# Patient Record
Sex: Male | Born: 1960 | Race: White | Hispanic: No | Marital: Single | Smoking: Never smoker
Health system: Southern US, Community
[De-identification: ages and names within clinical notes are randomized; demographics above are authoritative.]

## PROBLEM LIST (undated history)

## (undated) DIAGNOSIS — E871 Hypo-osmolality and hyponatremia: Secondary | ICD-10-CM

## (undated) DIAGNOSIS — G9340 Encephalopathy, unspecified: Secondary | ICD-10-CM

## (undated) DIAGNOSIS — H543 Unqualified visual loss, both eyes: Secondary | ICD-10-CM

## (undated) DIAGNOSIS — G40309 Generalized idiopathic epilepsy and epileptic syndromes, not intractable, without status epilepticus: Secondary | ICD-10-CM

## (undated) DIAGNOSIS — D696 Thrombocytopenia, unspecified: Secondary | ICD-10-CM

## (undated) DIAGNOSIS — G40209 Localization-related (focal) (partial) symptomatic epilepsy and epileptic syndromes with complex partial seizures, not intractable, without status epilepticus: Secondary | ICD-10-CM

## (undated) DIAGNOSIS — Z79899 Other long term (current) drug therapy: Secondary | ICD-10-CM

## (undated) HISTORY — DX: Unqualified visual loss, both eyes: H54.3

## (undated) HISTORY — DX: Hypo-osmolality and hyponatremia: E87.1

## (undated) HISTORY — PX: OTHER SURGICAL HISTORY: SHX169

## (undated) HISTORY — DX: Thrombocytopenia, unspecified: D69.6

## (undated) HISTORY — DX: Localization-related (focal) (partial) symptomatic epilepsy and epileptic syndromes with complex partial seizures, not intractable, without status epilepticus: G40.209

## (undated) HISTORY — DX: Encephalopathy, unspecified: G93.40

## (undated) HISTORY — DX: Generalized idiopathic epilepsy and epileptic syndromes, not intractable, without status epilepticus: G40.309

## (undated) HISTORY — DX: Other long term (current) drug therapy: Z79.899

---

## 2004-04-10 ENCOUNTER — Emergency Department (HOSPITAL_COMMUNITY): Admission: EM | Admit: 2004-04-10 | Discharge: 2004-04-11 | Payer: Self-pay | Admitting: Emergency Medicine

## 2008-03-20 ENCOUNTER — Emergency Department (HOSPITAL_COMMUNITY): Admission: EM | Admit: 2008-03-20 | Discharge: 2008-03-20 | Payer: Self-pay | Admitting: Emergency Medicine

## 2008-12-02 ENCOUNTER — Inpatient Hospital Stay (HOSPITAL_COMMUNITY): Admission: EM | Admit: 2008-12-02 | Discharge: 2008-12-20 | Payer: Self-pay | Admitting: Emergency Medicine

## 2008-12-25 ENCOUNTER — Inpatient Hospital Stay (HOSPITAL_COMMUNITY): Admission: EM | Admit: 2008-12-25 | Discharge: 2008-12-29 | Payer: Self-pay | Admitting: Emergency Medicine

## 2009-09-27 ENCOUNTER — Emergency Department (HOSPITAL_COMMUNITY): Admission: EM | Admit: 2009-09-27 | Discharge: 2009-09-27 | Payer: Self-pay | Admitting: Emergency Medicine

## 2009-12-12 ENCOUNTER — Encounter: Admission: RE | Admit: 2009-12-12 | Discharge: 2009-12-12 | Payer: Self-pay | Admitting: Nephrology

## 2009-12-26 ENCOUNTER — Emergency Department (HOSPITAL_COMMUNITY): Admission: EM | Admit: 2009-12-26 | Discharge: 2009-12-26 | Payer: Self-pay | Admitting: Emergency Medicine

## 2010-04-20 ENCOUNTER — Encounter: Payer: Self-pay | Admitting: Nephrology

## 2010-06-15 LAB — CBC
HCT: 34.5 % — ABNORMAL LOW (ref 39.0–52.0)
Hemoglobin: 12.1 g/dL — ABNORMAL LOW (ref 13.0–17.0)
MCHC: 35.2 g/dL (ref 30.0–36.0)
MCV: 92.9 fL (ref 78.0–100.0)
RBC: 3.72 MIL/uL — ABNORMAL LOW (ref 4.22–5.81)
WBC: 3.5 10*3/uL — ABNORMAL LOW (ref 4.0–10.5)

## 2010-06-15 LAB — DIFFERENTIAL
Basophils Absolute: 0 10*3/uL (ref 0.0–0.1)
Eosinophils Absolute: 0 10*3/uL (ref 0.0–0.7)
Lymphocytes Relative: 10 % — ABNORMAL LOW (ref 12–46)
Lymphs Abs: 0.4 10*3/uL — ABNORMAL LOW (ref 0.7–4.0)
Monocytes Absolute: 0.7 10*3/uL (ref 0.1–1.0)
Monocytes Relative: 20 % — ABNORMAL HIGH (ref 3–12)

## 2010-06-15 LAB — COMPREHENSIVE METABOLIC PANEL
Albumin: 3.4 g/dL — ABNORMAL LOW (ref 3.5–5.2)
BUN: 19 mg/dL (ref 6–23)
CO2: 21 mEq/L (ref 19–32)
Calcium: 10 mg/dL (ref 8.4–10.5)
Creatinine, Ser: 1.72 mg/dL — ABNORMAL HIGH (ref 0.4–1.5)
GFR calc Af Amer: 52 mL/min — ABNORMAL LOW (ref >60)
Sodium: 144 mEq/L (ref 135–145)

## 2010-06-15 LAB — URINALYSIS, ROUTINE W REFLEX MICROSCOPIC
Bilirubin Urine: NEGATIVE
Hgb urine dipstick: NEGATIVE
Ketones, ur: NEGATIVE mg/dL
Protein, ur: NEGATIVE mg/dL
Specific Gravity, Urine: 1.006 (ref 1.005–1.030)
Urobilinogen, UA: 1 mg/dL (ref 0.0–1.0)
pH: 6.5 (ref 5.0–8.0)

## 2010-07-03 LAB — BASIC METABOLIC PANEL
Calcium: 9.8 mg/dL (ref 8.4–10.5)
Creatinine, Ser: 1.18 mg/dL (ref 0.4–1.5)
GFR calc non Af Amer: >60 mL/min (ref >60)
Sodium: 145 mEq/L (ref 135–145)

## 2010-07-04 LAB — RENAL FUNCTION PANEL
Albumin: 4 g/dL (ref 3.5–5.2)
Albumin: 3.3 g/dL — ABNORMAL LOW (ref 3.5–5.2)
Albumin: 4.1 g/dL (ref 3.5–5.2)
Albumin: 4.4 g/dL (ref 3.5–5.2)
BUN: 14 mg/dL (ref 6–23)
BUN: 33 mg/dL — ABNORMAL HIGH (ref 6–23)
CO2: 22 meq/L (ref 19–32)
CO2: 25 mEq/L (ref 19–32)
Calcium: 10.6 mg/dL — ABNORMAL HIGH (ref 8.4–10.5)
Calcium: 10.9 mg/dL — ABNORMAL HIGH (ref 8.4–10.5)
Calcium: 9.9 mg/dL (ref 8.4–10.5)
Chloride: 101 mEq/L (ref 96–112)
Chloride: 101 mEq/L (ref 96–112)
Chloride: >130 meq/L — ABNORMAL HIGH (ref 96–112)
Creatinine, Ser: 1.45 mg/dL (ref 0.4–1.5)
Creatinine, Ser: 1.61 mg/dL — ABNORMAL HIGH (ref 0.4–1.5)
GFR calc Af Amer: 59 mL/min — ABNORMAL LOW (ref >60)
GFR calc Af Amer: >60 mL/min (ref >60)
GFR calc non Af Amer: 46 mL/min — ABNORMAL LOW
GFR calc non Af Amer: 49 mL/min — ABNORMAL LOW (ref >60)
GFR calc non Af Amer: 50 mL/min — ABNORMAL LOW (ref >60)
GFR calc non Af Amer: 52 mL/min — ABNORMAL LOW (ref >60)
Glucose, Bld: 147 mg/dL — ABNORMAL HIGH (ref 70–99)
Glucose, Bld: 96 mg/dL (ref 70–99)
Phosphorus: 4.2 mg/dL (ref 2.3–4.6)
Phosphorus: 2.6 mg/dL (ref 2.3–4.6)
Phosphorus: 4.3 mg/dL (ref 2.3–4.6)
Phosphorus: 4.4 mg/dL (ref 2.3–4.6)
Potassium: 4.1 mEq/L (ref 3.5–5.1)
Potassium: 3.3 meq/L — ABNORMAL LOW (ref 3.5–5.1)
Potassium: 3.8 mEq/L (ref 3.5–5.1)
Potassium: 4.1 mEq/L (ref 3.5–5.1)
Potassium: 4.1 mEq/L (ref 3.5–5.1)
Sodium: 131 mEq/L — ABNORMAL LOW (ref 135–145)
Sodium: 134 mEq/L — ABNORMAL LOW (ref 135–145)
Sodium: 135 mEq/L (ref 135–145)
Sodium: 163 meq/L (ref 135–145)

## 2010-07-04 LAB — COMPREHENSIVE METABOLIC PANEL
AST: 24 U/L (ref 0–37)
AST: 17 U/L (ref 0–37)
AST: 19 U/L (ref 0–37)
Albumin: 3.4 g/dL — ABNORMAL LOW (ref 3.5–5.2)
Albumin: 3.6 g/dL (ref 3.5–5.2)
Albumin: 4.3 g/dL (ref 3.5–5.2)
Alkaline Phosphatase: 61 U/L (ref 39–117)
BUN: 11 mg/dL (ref 6–23)
BUN: 12 mg/dL (ref 6–23)
BUN: 15 mg/dL (ref 6–23)
BUN: 12 mg/dL (ref 6–23)
BUN: 14 mg/dL (ref 6–23)
CO2: 21 mEq/L (ref 19–32)
Calcium: 9.2 mg/dL (ref 8.4–10.5)
Calcium: 9.7 mg/dL (ref 8.4–10.5)
Calcium: 10.6 mg/dL — ABNORMAL HIGH (ref 8.4–10.5)
Chloride: 108 mEq/L (ref 96–112)
Chloride: >130 mEq/L — ABNORMAL HIGH (ref 96–112)
Chloride: >130 mEq/L — ABNORMAL HIGH (ref 96–112)
Creatinine, Ser: 1 mg/dL (ref 0.4–1.5)
Creatinine, Ser: 1.05 mg/dL (ref 0.4–1.5)
Creatinine, Ser: 1.33 mg/dL (ref 0.4–1.5)
Creatinine, Ser: 1.49 mg/dL (ref 0.4–1.5)
Creatinine, Ser: 1.6 mg/dL — ABNORMAL HIGH (ref 0.4–1.5)
GFR calc Af Amer: >60 mL/min (ref >60)
GFR calc Af Amer: >60 mL/min (ref >60)
GFR calc non Af Amer: 47 mL/min — ABNORMAL LOW (ref >60)
Glucose, Bld: 161 mg/dL — ABNORMAL HIGH (ref 70–99)
Glucose, Bld: 92 mg/dL (ref 70–99)
Glucose, Bld: 181 mg/dL — ABNORMAL HIGH (ref 70–99)
Potassium: 3.9 mEq/L (ref 3.5–5.1)
Potassium: 3.9 mEq/L (ref 3.5–5.1)
Total Bilirubin: 0.4 mg/dL (ref 0.3–1.2)
Total Bilirubin: 0.5 mg/dL (ref 0.3–1.2)
Total Protein: 6 g/dL (ref 6.0–8.3)
Total Protein: 6.3 g/dL (ref 6.0–8.3)
Total Protein: 6.3 g/dL (ref 6.0–8.3)

## 2010-07-04 LAB — BASIC METABOLIC PANEL WITH GFR
BUN: 12 mg/dL (ref 6–23)
BUN: 12 mg/dL (ref 6–23)
BUN: 12 mg/dL (ref 6–23)
BUN: 12 mg/dL (ref 6–23)
BUN: 13 mg/dL (ref 6–23)
BUN: 13 mg/dL (ref 6–23)
BUN: 13 mg/dL (ref 6–23)
BUN: 13 mg/dL (ref 6–23)
BUN: 14 mg/dL (ref 6–23)
BUN: 14 mg/dL (ref 6–23)
BUN: 15 mg/dL (ref 6–23)
BUN: 15 mg/dL (ref 6–23)
BUN: 15 mg/dL (ref 6–23)
BUN: 16 mg/dL (ref 6–23)
BUN: 17 mg/dL (ref 6–23)
CO2: 18 meq/L — ABNORMAL LOW (ref 19–32)
CO2: 19 meq/L (ref 19–32)
CO2: 20 meq/L (ref 19–32)
CO2: 21 meq/L (ref 19–32)
CO2: 22 meq/L (ref 19–32)
CO2: 22 meq/L (ref 19–32)
CO2: 22 meq/L (ref 19–32)
CO2: 23 meq/L (ref 19–32)
CO2: 23 meq/L (ref 19–32)
CO2: 23 meq/L (ref 19–32)
CO2: 23 meq/L (ref 19–32)
CO2: 24 meq/L (ref 19–32)
CO2: 24 meq/L (ref 19–32)
CO2: 24 meq/L (ref 19–32)
CO2: 25 meq/L (ref 19–32)
Calcium: 10 mg/dL (ref 8.4–10.5)
Calcium: 10.2 mg/dL (ref 8.4–10.5)
Calcium: 10.2 mg/dL (ref 8.4–10.5)
Calcium: 10.3 mg/dL (ref 8.4–10.5)
Calcium: 10.3 mg/dL (ref 8.4–10.5)
Calcium: 10.7 mg/dL — ABNORMAL HIGH (ref 8.4–10.5)
Calcium: 9 mg/dL (ref 8.4–10.5)
Calcium: 9.3 mg/dL (ref 8.4–10.5)
Calcium: 9.3 mg/dL (ref 8.4–10.5)
Calcium: 9.4 mg/dL (ref 8.4–10.5)
Calcium: 9.4 mg/dL (ref 8.4–10.5)
Calcium: 9.4 mg/dL (ref 8.4–10.5)
Calcium: 9.5 mg/dL (ref 8.4–10.5)
Calcium: 9.5 mg/dL (ref 8.4–10.5)
Calcium: 9.8 mg/dL (ref 8.4–10.5)
Chloride: 125 meq/L — ABNORMAL HIGH (ref 96–112)
Chloride: 126 meq/L — ABNORMAL HIGH (ref 96–112)
Chloride: 127 meq/L — ABNORMAL HIGH (ref 96–112)
Chloride: 128 meq/L — ABNORMAL HIGH (ref 96–112)
Chloride: 130 meq/L — ABNORMAL HIGH (ref 96–112)
Chloride: >130 meq/L — ABNORMAL HIGH (ref 96–112)
Chloride: >130 meq/L — ABNORMAL HIGH (ref 96–112)
Chloride: >130 meq/L — ABNORMAL HIGH (ref 96–112)
Chloride: >130 meq/L — ABNORMAL HIGH (ref 96–112)
Chloride: >130 meq/L — ABNORMAL HIGH (ref 96–112)
Chloride: >130 meq/L — ABNORMAL HIGH (ref 96–112)
Chloride: >130 meq/L — ABNORMAL HIGH (ref 96–112)
Chloride: >130 meq/L — ABNORMAL HIGH (ref 96–112)
Chloride: >130 meq/L — ABNORMAL HIGH (ref 96–112)
Chloride: >130 meq/L — ABNORMAL HIGH (ref 96–112)
Creatinine, Ser: 1.14 mg/dL (ref 0.4–1.5)
Creatinine, Ser: 1.17 mg/dL (ref 0.4–1.5)
Creatinine, Ser: 1.2 mg/dL (ref 0.4–1.5)
Creatinine, Ser: 1.22 mg/dL (ref 0.4–1.5)
Creatinine, Ser: 1.24 mg/dL (ref 0.4–1.5)
Creatinine, Ser: 1.29 mg/dL (ref 0.4–1.5)
Creatinine, Ser: 1.41 mg/dL (ref 0.4–1.5)
Creatinine, Ser: 1.43 mg/dL (ref 0.4–1.5)
Creatinine, Ser: 1.54 mg/dL — ABNORMAL HIGH (ref 0.4–1.5)
Creatinine, Ser: 1.58 mg/dL — ABNORMAL HIGH (ref 0.4–1.5)
Creatinine, Ser: 1.6 mg/dL — ABNORMAL HIGH (ref 0.4–1.5)
Creatinine, Ser: 1.66 mg/dL — ABNORMAL HIGH (ref 0.4–1.5)
Creatinine, Ser: 1.71 mg/dL — ABNORMAL HIGH (ref 0.4–1.5)
Creatinine, Ser: 1.76 mg/dL — ABNORMAL HIGH (ref 0.4–1.5)
Creatinine, Ser: 1.77 mg/dL — ABNORMAL HIGH (ref 0.4–1.5)
GFR calc non Af Amer: 41 mL/min — ABNORMAL LOW
GFR calc non Af Amer: 42 mL/min — ABNORMAL LOW
GFR calc non Af Amer: 43 mL/min — ABNORMAL LOW
GFR calc non Af Amer: 45 mL/min — ABNORMAL LOW
GFR calc non Af Amer: 47 mL/min — ABNORMAL LOW
GFR calc non Af Amer: 47 mL/min — ABNORMAL LOW
GFR calc non Af Amer: 49 mL/min — ABNORMAL LOW
GFR calc non Af Amer: 53 mL/min — ABNORMAL LOW
GFR calc non Af Amer: 54 mL/min — ABNORMAL LOW
GFR calc non Af Amer: 60 mL/min — ABNORMAL LOW
GFR calc non Af Amer: >60 mL/min
GFR calc non Af Amer: >60 mL/min
GFR calc non Af Amer: >60 mL/min
GFR calc non Af Amer: >60 mL/min
GFR calc non Af Amer: >60 mL/min
Glucose, Bld: 114 mg/dL — ABNORMAL HIGH (ref 70–99)
Glucose, Bld: 117 mg/dL — ABNORMAL HIGH (ref 70–99)
Glucose, Bld: 118 mg/dL — ABNORMAL HIGH (ref 70–99)
Glucose, Bld: 121 mg/dL — ABNORMAL HIGH (ref 70–99)
Glucose, Bld: 122 mg/dL — ABNORMAL HIGH (ref 70–99)
Glucose, Bld: 125 mg/dL — ABNORMAL HIGH (ref 70–99)
Glucose, Bld: 127 mg/dL — ABNORMAL HIGH (ref 70–99)
Glucose, Bld: 130 mg/dL — ABNORMAL HIGH (ref 70–99)
Glucose, Bld: 134 mg/dL — ABNORMAL HIGH (ref 70–99)
Glucose, Bld: 134 mg/dL — ABNORMAL HIGH (ref 70–99)
Glucose, Bld: 136 mg/dL — ABNORMAL HIGH (ref 70–99)
Glucose, Bld: 141 mg/dL — ABNORMAL HIGH (ref 70–99)
Glucose, Bld: 153 mg/dL — ABNORMAL HIGH (ref 70–99)
Glucose, Bld: 160 mg/dL — ABNORMAL HIGH (ref 70–99)
Glucose, Bld: 166 mg/dL — ABNORMAL HIGH (ref 70–99)
Potassium: 3.3 meq/L — ABNORMAL LOW (ref 3.5–5.1)
Potassium: 3.4 meq/L — ABNORMAL LOW (ref 3.5–5.1)
Potassium: 3.4 meq/L — ABNORMAL LOW (ref 3.5–5.1)
Potassium: 3.4 meq/L — ABNORMAL LOW (ref 3.5–5.1)
Potassium: 3.5 meq/L (ref 3.5–5.1)
Potassium: 3.5 meq/L (ref 3.5–5.1)
Potassium: 3.5 meq/L (ref 3.5–5.1)
Potassium: 3.5 meq/L (ref 3.5–5.1)
Potassium: 3.6 meq/L (ref 3.5–5.1)
Potassium: 3.6 meq/L (ref 3.5–5.1)
Potassium: 3.6 meq/L (ref 3.5–5.1)
Potassium: 3.7 meq/L (ref 3.5–5.1)
Potassium: 3.8 meq/L (ref 3.5–5.1)
Potassium: 3.8 meq/L (ref 3.5–5.1)
Potassium: 3.8 meq/L (ref 3.5–5.1)
Sodium: 153 meq/L — ABNORMAL HIGH (ref 135–145)
Sodium: 153 meq/L — ABNORMAL HIGH (ref 135–145)
Sodium: 155 meq/L — ABNORMAL HIGH (ref 135–145)
Sodium: 156 meq/L — ABNORMAL HIGH (ref 135–145)
Sodium: 157 meq/L — ABNORMAL HIGH (ref 135–145)
Sodium: 157 meq/L — ABNORMAL HIGH (ref 135–145)
Sodium: 158 meq/L — ABNORMAL HIGH (ref 135–145)
Sodium: 163 meq/L (ref 135–145)
Sodium: 164 meq/L (ref 135–145)
Sodium: 173 meq/L (ref 135–145)
Sodium: 174 meq/L (ref 135–145)
Sodium: 177 meq/L (ref 135–145)
Sodium: 180 meq/L (ref 135–145)
Sodium: 183 meq/L (ref 135–145)
Sodium: 183 meq/L (ref 135–145)

## 2010-07-04 LAB — FERRITIN
Ferritin: 109 ng/mL (ref 22–322)
Ferritin: 186 ng/mL (ref 22–322)

## 2010-07-04 LAB — BASIC METABOLIC PANEL
BUN: 18 mg/dL (ref 6–23)
BUN: 28 mg/dL — ABNORMAL HIGH (ref 6–23)
BUN: 41 mg/dL — ABNORMAL HIGH (ref 6–23)
BUN: 11 mg/dL (ref 6–23)
BUN: 12 mg/dL (ref 6–23)
BUN: 14 mg/dL (ref 6–23)
BUN: 17 mg/dL (ref 6–23)
BUN: 17 mg/dL (ref 6–23)
CO2: 24 mEq/L (ref 19–32)
CO2: 30 mEq/L (ref 19–32)
CO2: 19 mEq/L (ref 19–32)
CO2: 21 mEq/L (ref 19–32)
CO2: 21 mEq/L (ref 19–32)
CO2: 21 mEq/L (ref 19–32)
CO2: 22 mEq/L (ref 19–32)
Calcium: 10 mg/dL (ref 8.4–10.5)
Calcium: 10.5 mg/dL (ref 8.4–10.5)
Calcium: 10.6 mg/dL — ABNORMAL HIGH (ref 8.4–10.5)
Calcium: 10.7 mg/dL — ABNORMAL HIGH (ref 8.4–10.5)
Calcium: 11 mg/dL — ABNORMAL HIGH (ref 8.4–10.5)
Calcium: 11.2 mg/dL — ABNORMAL HIGH (ref 8.4–10.5)
Calcium: 10 mg/dL (ref 8.4–10.5)
Calcium: 10.1 mg/dL (ref 8.4–10.5)
Calcium: 10.2 mg/dL (ref 8.4–10.5)
Calcium: 9.3 mg/dL (ref 8.4–10.5)
Calcium: 9.7 mg/dL (ref 8.4–10.5)
Chloride: 103 mEq/L (ref 96–112)
Chloride: 104 mEq/L (ref 96–112)
Chloride: 107 mEq/L (ref 96–112)
Chloride: 108 mEq/L (ref 96–112)
Chloride: 109 mEq/L (ref 96–112)
Chloride: 114 mEq/L — ABNORMAL HIGH (ref 96–112)
Chloride: 107 mEq/L (ref 96–112)
Chloride: 127 mEq/L — ABNORMAL HIGH (ref 96–112)
Chloride: >130 mEq/L — ABNORMAL HIGH (ref 96–112)
Chloride: >130 mEq/L — ABNORMAL HIGH (ref 96–112)
Chloride: >130 mEq/L — ABNORMAL HIGH (ref 96–112)
Chloride: >130 mEq/L — ABNORMAL HIGH (ref 96–112)
Chloride: >130 mEq/L — ABNORMAL HIGH (ref 96–112)
Chloride: >130 mEq/L — ABNORMAL HIGH (ref 96–112)
Creatinine, Ser: 1.03 mg/dL (ref 0.4–1.5)
Creatinine, Ser: 1.41 mg/dL (ref 0.4–1.5)
Creatinine, Ser: 1.59 mg/dL — ABNORMAL HIGH (ref 0.4–1.5)
Creatinine, Ser: 1.75 mg/dL — ABNORMAL HIGH (ref 0.4–1.5)
GFR calc Af Amer: >60 mL/min (ref >60)
GFR calc Af Amer: >60 mL/min (ref >60)
GFR calc Af Amer: >60 mL/min (ref >60)
GFR calc Af Amer: 56 mL/min — ABNORMAL LOW (ref >60)
GFR calc Af Amer: 58 mL/min — ABNORMAL LOW (ref >60)
GFR calc Af Amer: 58 mL/min — ABNORMAL LOW (ref >60)
GFR calc Af Amer: 59 mL/min — ABNORMAL LOW (ref >60)
GFR calc Af Amer: >60 mL/min (ref >60)
GFR calc Af Amer: >60 mL/min (ref >60)
GFR calc Af Amer: >60 mL/min (ref >60)
GFR calc Af Amer: >60 mL/min (ref >60)
GFR calc Af Amer: >60 mL/min (ref >60)
GFR calc non Af Amer: >60 mL/min (ref >60)
GFR calc non Af Amer: >60 mL/min (ref >60)
GFR calc non Af Amer: >60 mL/min (ref >60)
GFR calc non Af Amer: >60 mL/min (ref >60)
GFR calc non Af Amer: 46 mL/min — ABNORMAL LOW (ref >60)
GFR calc non Af Amer: 47 mL/min — ABNORMAL LOW (ref >60)
GFR calc non Af Amer: 48 mL/min — ABNORMAL LOW (ref >60)
GFR calc non Af Amer: 48 mL/min — ABNORMAL LOW (ref >60)
GFR calc non Af Amer: 49 mL/min — ABNORMAL LOW (ref >60)
GFR calc non Af Amer: 56 mL/min — ABNORMAL LOW (ref >60)
GFR calc non Af Amer: 57 mL/min — ABNORMAL LOW (ref >60)
GFR calc non Af Amer: >60 mL/min (ref >60)
GFR calc non Af Amer: >60 mL/min (ref >60)
Glucose, Bld: 119 mg/dL — ABNORMAL HIGH (ref 70–99)
Glucose, Bld: 81 mg/dL (ref 70–99)
Glucose, Bld: 91 mg/dL (ref 70–99)
Glucose, Bld: 118 mg/dL — ABNORMAL HIGH (ref 70–99)
Glucose, Bld: 121 mg/dL — ABNORMAL HIGH (ref 70–99)
Glucose, Bld: 123 mg/dL — ABNORMAL HIGH (ref 70–99)
Glucose, Bld: 133 mg/dL — ABNORMAL HIGH (ref 70–99)
Glucose, Bld: 150 mg/dL — ABNORMAL HIGH (ref 70–99)
Glucose, Bld: 163 mg/dL — ABNORMAL HIGH (ref 70–99)
Potassium: 3.2 mEq/L — ABNORMAL LOW (ref 3.5–5.1)
Potassium: 3.6 mEq/L (ref 3.5–5.1)
Potassium: 3.9 mEq/L (ref 3.5–5.1)
Potassium: 4.1 mEq/L (ref 3.5–5.1)
Potassium: 4.1 mEq/L (ref 3.5–5.1)
Potassium: 3.2 mEq/L — ABNORMAL LOW (ref 3.5–5.1)
Potassium: 3.2 mEq/L — ABNORMAL LOW (ref 3.5–5.1)
Potassium: 3.3 mEq/L — ABNORMAL LOW (ref 3.5–5.1)
Potassium: 3.4 mEq/L — ABNORMAL LOW (ref 3.5–5.1)
Potassium: 3.4 mEq/L — ABNORMAL LOW (ref 3.5–5.1)
Potassium: 3.5 mEq/L (ref 3.5–5.1)
Potassium: 3.5 mEq/L (ref 3.5–5.1)
Potassium: 3.5 mEq/L (ref 3.5–5.1)
Potassium: 3.6 mEq/L (ref 3.5–5.1)
Potassium: 3.6 mEq/L (ref 3.5–5.1)
Potassium: 3.7 mEq/L (ref 3.5–5.1)
Potassium: 3.9 mEq/L (ref 3.5–5.1)
Sodium: 137 mEq/L (ref 135–145)
Sodium: 138 mEq/L (ref 135–145)
Sodium: 143 mEq/L (ref 135–145)
Sodium: 144 mEq/L (ref 135–145)
Sodium: 146 mEq/L — ABNORMAL HIGH (ref 135–145)
Sodium: 145 mEq/L (ref 135–145)
Sodium: 148 mEq/L — ABNORMAL HIGH (ref 135–145)
Sodium: 150 mEq/L — ABNORMAL HIGH (ref 135–145)
Sodium: 158 mEq/L — ABNORMAL HIGH (ref 135–145)
Sodium: 158 mEq/L — ABNORMAL HIGH (ref 135–145)
Sodium: 159 mEq/L — ABNORMAL HIGH (ref 135–145)
Sodium: 161 mEq/L (ref 135–145)
Sodium: 161 mEq/L (ref 135–145)
Sodium: 164 mEq/L (ref 135–145)
Sodium: 174 mEq/L (ref 135–145)
Sodium: 175 mEq/L (ref 135–145)

## 2010-07-04 LAB — CBC
HCT: 29.9 % — ABNORMAL LOW (ref 39.0–52.0)
HCT: 31.3 % — ABNORMAL LOW (ref 39.0–52.0)
HCT: 32 % — ABNORMAL LOW (ref 39.0–52.0)
HCT: 32.8 % — ABNORMAL LOW (ref 39.0–52.0)
HCT: 35.9 % — ABNORMAL LOW (ref 39.0–52.0)
HCT: 40.7 % (ref 39.0–52.0)
Hemoglobin: 10.5 g/dL — ABNORMAL LOW (ref 13.0–17.0)
Hemoglobin: 10.8 g/dL — ABNORMAL LOW (ref 13.0–17.0)
Hemoglobin: 11 g/dL — ABNORMAL LOW (ref 13.0–17.0)
Hemoglobin: 12.1 g/dL — ABNORMAL LOW (ref 13.0–17.0)
MCHC: 34.6 g/dL (ref 30.0–36.0)
MCHC: 35 g/dL (ref 30.0–36.0)
MCHC: 33.4 g/dL (ref 30.0–36.0)
MCHC: 33.6 g/dL (ref 30.0–36.0)
MCV: 85.3 fL (ref 78.0–100.0)
MCV: 87.3 fL (ref 78.0–100.0)
MCV: 87.4 fL (ref 78.0–100.0)
MCV: 90 fL (ref 78.0–100.0)
MCV: 90.6 fL (ref 78.0–100.0)
Platelets: 122 10*3/uL — ABNORMAL LOW (ref 150–400)
Platelets: 134 10*3/uL — ABNORMAL LOW (ref 150–400)
Platelets: 123 K/uL — ABNORMAL LOW (ref 150–400)
Platelets: 133 10*3/uL — ABNORMAL LOW (ref 150–400)
Platelets: 93 K/uL — ABNORMAL LOW (ref 150–400)
RBC: 3.62 MIL/uL — ABNORMAL LOW (ref 4.22–5.81)
RBC: 3.66 MIL/uL — ABNORMAL LOW (ref 4.22–5.81)
RBC: 3.99 MIL/uL — ABNORMAL LOW (ref 4.22–5.81)
RDW: 13.9 % (ref 11.5–15.5)
RDW: 14 % (ref 11.5–15.5)
RDW: 14.9 % (ref 11.5–15.5)
RDW: 15.9 % — ABNORMAL HIGH (ref 11.5–15.5)
WBC: 4.1 10*3/uL (ref 4.0–10.5)
WBC: 5.9 K/uL (ref 4.0–10.5)
WBC: 7.9 K/uL (ref 4.0–10.5)
WBC: 8.6 10*3/uL (ref 4.0–10.5)

## 2010-07-04 LAB — MAGNESIUM
Magnesium: 1.8 mg/dL (ref 1.5–2.5)
Magnesium: 2.2 mg/dL (ref 1.5–2.5)
Magnesium: 2.7 mg/dL — ABNORMAL HIGH (ref 1.5–2.5)

## 2010-07-04 LAB — URINALYSIS, ROUTINE W REFLEX MICROSCOPIC
Bilirubin Urine: NEGATIVE
Glucose, UA: NEGATIVE mg/dL
Glucose, UA: NEGATIVE mg/dL
Hgb urine dipstick: NEGATIVE
Ketones, ur: NEGATIVE mg/dL
Ketones, ur: NEGATIVE mg/dL
Leukocytes, UA: NEGATIVE
Nitrite: NEGATIVE
Nitrite: NEGATIVE
Protein, ur: NEGATIVE mg/dL
Protein, ur: NEGATIVE mg/dL
Specific Gravity, Urine: 1.008 (ref 1.005–1.030)
Specific Gravity, Urine: 1.009 (ref 1.005–1.030)
Urobilinogen, UA: 0.2 mg/dL (ref 0.0–1.0)
Urobilinogen, UA: 0.2 mg/dL (ref 0.0–1.0)
Urobilinogen, UA: 0.2 mg/dL (ref 0.0–1.0)
pH: 6 (ref 5.0–8.0)
pH: 6.5 (ref 5.0–8.0)

## 2010-07-04 LAB — POCT I-STAT, CHEM 8
BUN: 16 mg/dL (ref 6–23)
Creatinine, Ser: 1 mg/dL (ref 0.4–1.5)
Hemoglobin: 10.2 g/dL — ABNORMAL LOW (ref 13.0–17.0)
Potassium: 3.8 mEq/L (ref 3.5–5.1)
Sodium: 124 mEq/L — ABNORMAL LOW (ref 135–145)
TCO2: 17 mmol/L (ref 0–100)

## 2010-07-04 LAB — POCT I-STAT 4, (NA,K, GLUC, HGB,HCT)
Glucose, Bld: 120 mg/dL — ABNORMAL HIGH (ref 70–99)
HCT: 31 % — ABNORMAL LOW (ref 39.0–52.0)
Hemoglobin: 10.5 g/dL — ABNORMAL LOW (ref 13.0–17.0)
Potassium: 3.7 meq/L (ref 3.5–5.1)
Sodium: 172 meq/L (ref 135–145)

## 2010-07-04 LAB — GLUCOSE, CAPILLARY
Glucose-Capillary: 153 mg/dL — ABNORMAL HIGH (ref 70–99)
Glucose-Capillary: 108 mg/dL — ABNORMAL HIGH (ref 70–99)

## 2010-07-04 LAB — RETICULOCYTES
RBC.: 3.63 MIL/uL — ABNORMAL LOW (ref 4.22–5.81)
Retic Ct Pct: 1.4 % (ref 0.4–3.1)

## 2010-07-04 LAB — LITHIUM LEVEL
Lithium Lvl: <0.25 mEq/L — ABNORMAL LOW (ref 0.80–1.40)
Lithium Lvl: 0.58 meq/L — ABNORMAL LOW (ref 0.80–1.40)

## 2010-07-04 LAB — MRSA PCR SCREENING: MRSA by PCR: NEGATIVE

## 2010-07-04 LAB — DIFFERENTIAL
Basophils Absolute: 0 10*3/uL (ref 0.0–0.1)
Basophils Relative: 0 % (ref 0–1)
Basophils Relative: 0 % (ref 0–1)
Eosinophils Absolute: 0 10*3/uL (ref 0.0–0.7)
Eosinophils Relative: 0 % (ref 0–5)
Lymphocytes Relative: 3 % — ABNORMAL LOW (ref 12–46)
Lymphs Abs: 0.5 10*3/uL — ABNORMAL LOW (ref 0.7–4.0)
Monocytes Absolute: 0.3 10*3/uL (ref 0.1–1.0)
Monocytes Relative: 3 % (ref 3–12)
Neutro Abs: 9.1 10*3/uL — ABNORMAL HIGH (ref 1.7–7.7)
Neutrophils Relative %: 94 % — ABNORMAL HIGH (ref 43–77)
Neutrophils Relative %: 91 % — ABNORMAL HIGH (ref 43–77)

## 2010-07-04 LAB — OSMOLALITY, URINE
Osmolality, Ur: 184 mOsm/kg — ABNORMAL LOW (ref 390–1090)
Osmolality, Ur: 232 mosm/kg — ABNORMAL LOW (ref 390–1090)
Osmolality, Ur: 232 mosm/kg — ABNORMAL LOW (ref 390–1090)
Osmolality, Ur: 359 mosm/kg — ABNORMAL LOW (ref 390–1090)
Osmolality, Ur: 437 mosm/kg (ref 390–1090)
Osmolality, Ur: 457 mosm/kg (ref 390–1090)

## 2010-07-04 LAB — T4, FREE: Free T4: 1.49 ng/dL (ref 0.80–1.80)

## 2010-07-04 LAB — URINE MICROSCOPIC-ADD ON

## 2010-07-04 LAB — URINE CULTURE
Colony Count: NO GROWTH
Culture: NO GROWTH
Culture: NO GROWTH

## 2010-07-04 LAB — HEMOGLOBIN A1C
Hgb A1c MFr Bld: 4.8 % (ref 4.6–6.1)
Mean Plasma Glucose: 88 mg/dL
Mean Plasma Glucose: 91 mg/dL

## 2010-07-04 LAB — PROTIME-INR
INR: 1.2 (ref 0.00–1.49)
Prothrombin Time: 14.7 s (ref 11.6–15.2)

## 2010-07-04 LAB — IRON AND TIBC
Saturation Ratios: 52 % (ref 20–55)
TIBC: 229 ug/dL (ref 215–435)
UIBC: 111 ug/dL

## 2010-07-04 LAB — CARBAMAZEPINE LEVEL, TOTAL
Carbamazepine Lvl: 3.3 ug/mL — ABNORMAL LOW (ref 4.0–12.0)
Carbamazepine Lvl: 8.4 ug/mL (ref 4.0–12.0)

## 2010-07-04 LAB — APTT: aPTT: 25 seconds (ref 24–37)

## 2010-07-04 LAB — TSH: TSH: 0.37 u[IU]/mL (ref 0.350–4.500)

## 2010-07-04 LAB — PHOSPHORUS: Phosphorus: 3 mg/dL (ref 2.3–4.6)

## 2010-10-28 ENCOUNTER — Ambulatory Visit: Payer: Medicare Other | Attending: Neurology | Admitting: Physical Therapy

## 2010-10-28 DIAGNOSIS — IMO0001 Reserved for inherently not codable concepts without codable children: Secondary | ICD-10-CM | POA: Insufficient documentation

## 2010-10-28 DIAGNOSIS — R269 Unspecified abnormalities of gait and mobility: Secondary | ICD-10-CM | POA: Insufficient documentation

## 2010-10-28 DIAGNOSIS — M6281 Muscle weakness (generalized): Secondary | ICD-10-CM | POA: Insufficient documentation

## 2010-11-05 ENCOUNTER — Ambulatory Visit: Payer: Medicare Other | Attending: Neurology | Admitting: Physical Therapy

## 2010-11-05 DIAGNOSIS — R269 Unspecified abnormalities of gait and mobility: Secondary | ICD-10-CM | POA: Insufficient documentation

## 2010-11-05 DIAGNOSIS — IMO0001 Reserved for inherently not codable concepts without codable children: Secondary | ICD-10-CM | POA: Insufficient documentation

## 2010-11-05 DIAGNOSIS — M6281 Muscle weakness (generalized): Secondary | ICD-10-CM | POA: Insufficient documentation

## 2010-11-10 ENCOUNTER — Ambulatory Visit: Payer: Medicare Other | Admitting: Physical Therapy

## 2010-11-12 ENCOUNTER — Ambulatory Visit: Payer: Medicare Other | Admitting: Physical Therapy

## 2010-11-17 ENCOUNTER — Ambulatory Visit: Payer: Medicare Other | Admitting: Physical Therapy

## 2010-11-19 ENCOUNTER — Ambulatory Visit: Payer: Medicare Other | Admitting: Occupational Therapy

## 2010-11-24 ENCOUNTER — Ambulatory Visit: Payer: Medicare Other | Admitting: Physical Therapy

## 2010-11-24 ENCOUNTER — Ambulatory Visit: Payer: Medicare Other | Admitting: Occupational Therapy

## 2010-12-02 ENCOUNTER — Ambulatory Visit: Payer: Medicare Other | Attending: Neurology | Admitting: Occupational Therapy

## 2010-12-02 DIAGNOSIS — R269 Unspecified abnormalities of gait and mobility: Secondary | ICD-10-CM | POA: Insufficient documentation

## 2010-12-02 DIAGNOSIS — M6281 Muscle weakness (generalized): Secondary | ICD-10-CM | POA: Insufficient documentation

## 2010-12-02 DIAGNOSIS — IMO0001 Reserved for inherently not codable concepts without codable children: Secondary | ICD-10-CM | POA: Insufficient documentation

## 2010-12-04 ENCOUNTER — Ambulatory Visit: Payer: Medicare Other | Admitting: Occupational Therapy

## 2010-12-09 ENCOUNTER — Ambulatory Visit: Payer: Medicare Other | Admitting: Occupational Therapy

## 2010-12-11 ENCOUNTER — Ambulatory Visit: Payer: Medicare Other | Admitting: Occupational Therapy

## 2010-12-16 ENCOUNTER — Ambulatory Visit: Payer: Medicare Other | Admitting: Occupational Therapy

## 2010-12-18 ENCOUNTER — Ambulatory Visit: Payer: Medicare Other | Admitting: Occupational Therapy

## 2010-12-23 ENCOUNTER — Encounter: Payer: Medicare Other | Admitting: Occupational Therapy

## 2010-12-25 ENCOUNTER — Encounter: Payer: Medicare Other | Admitting: Occupational Therapy

## 2010-12-31 ENCOUNTER — Encounter: Payer: Medicare Other | Admitting: Occupational Therapy

## 2011-01-02 LAB — CBC
Platelets: 92 10*3/uL — ABNORMAL LOW (ref 150–400)
RBC: 4.64 MIL/uL (ref 4.22–5.81)
WBC: 5.5 10*3/uL (ref 4.0–10.5)

## 2011-01-02 LAB — COMPREHENSIVE METABOLIC PANEL
ALT: 32 U/L (ref 0–53)
AST: 19 U/L (ref 0–37)
Albumin: 3.8 g/dL (ref 3.5–5.2)
CO2: 30 mEq/L (ref 19–32)
Chloride: 119 mEq/L — ABNORMAL HIGH (ref 96–112)
Creatinine, Ser: 1.62 mg/dL — ABNORMAL HIGH (ref 0.4–1.5)
GFR calc Af Amer: 56 mL/min — ABNORMAL LOW (ref >60)
GFR calc non Af Amer: 46 mL/min — ABNORMAL LOW (ref >60)
Potassium: 4.3 mEq/L (ref 3.5–5.1)
Sodium: 155 mEq/L — ABNORMAL HIGH (ref 135–145)
Total Bilirubin: 0.6 mg/dL (ref 0.3–1.2)

## 2011-01-02 LAB — URINALYSIS, ROUTINE W REFLEX MICROSCOPIC
Bilirubin Urine: NEGATIVE
Glucose, UA: NEGATIVE mg/dL
Ketones, ur: NEGATIVE mg/dL
Protein, ur: NEGATIVE mg/dL
pH: 6.5 (ref 5.0–8.0)

## 2011-01-02 LAB — DIFFERENTIAL
Basophils Absolute: 0 10*3/uL (ref 0.0–0.1)
Eosinophils Absolute: 0.1 10*3/uL (ref 0.0–0.7)
Eosinophils Relative: 1 % (ref 0–5)
Lymphocytes Relative: 18 % (ref 12–46)
Monocytes Absolute: 0.3 10*3/uL (ref 0.1–1.0)

## 2011-01-13 ENCOUNTER — Encounter: Payer: Medicare Other | Admitting: Occupational Therapy
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
## 2011-03-16 IMAGING — CR DG CHEST 1V PORT
1 series · 1 of 1 positions shown · non-contrast
Comparison: 12/03/2008

CLINICAL DATA: PICC line placement.

PORTABLE CHEST - 1 VIEW

[AP]
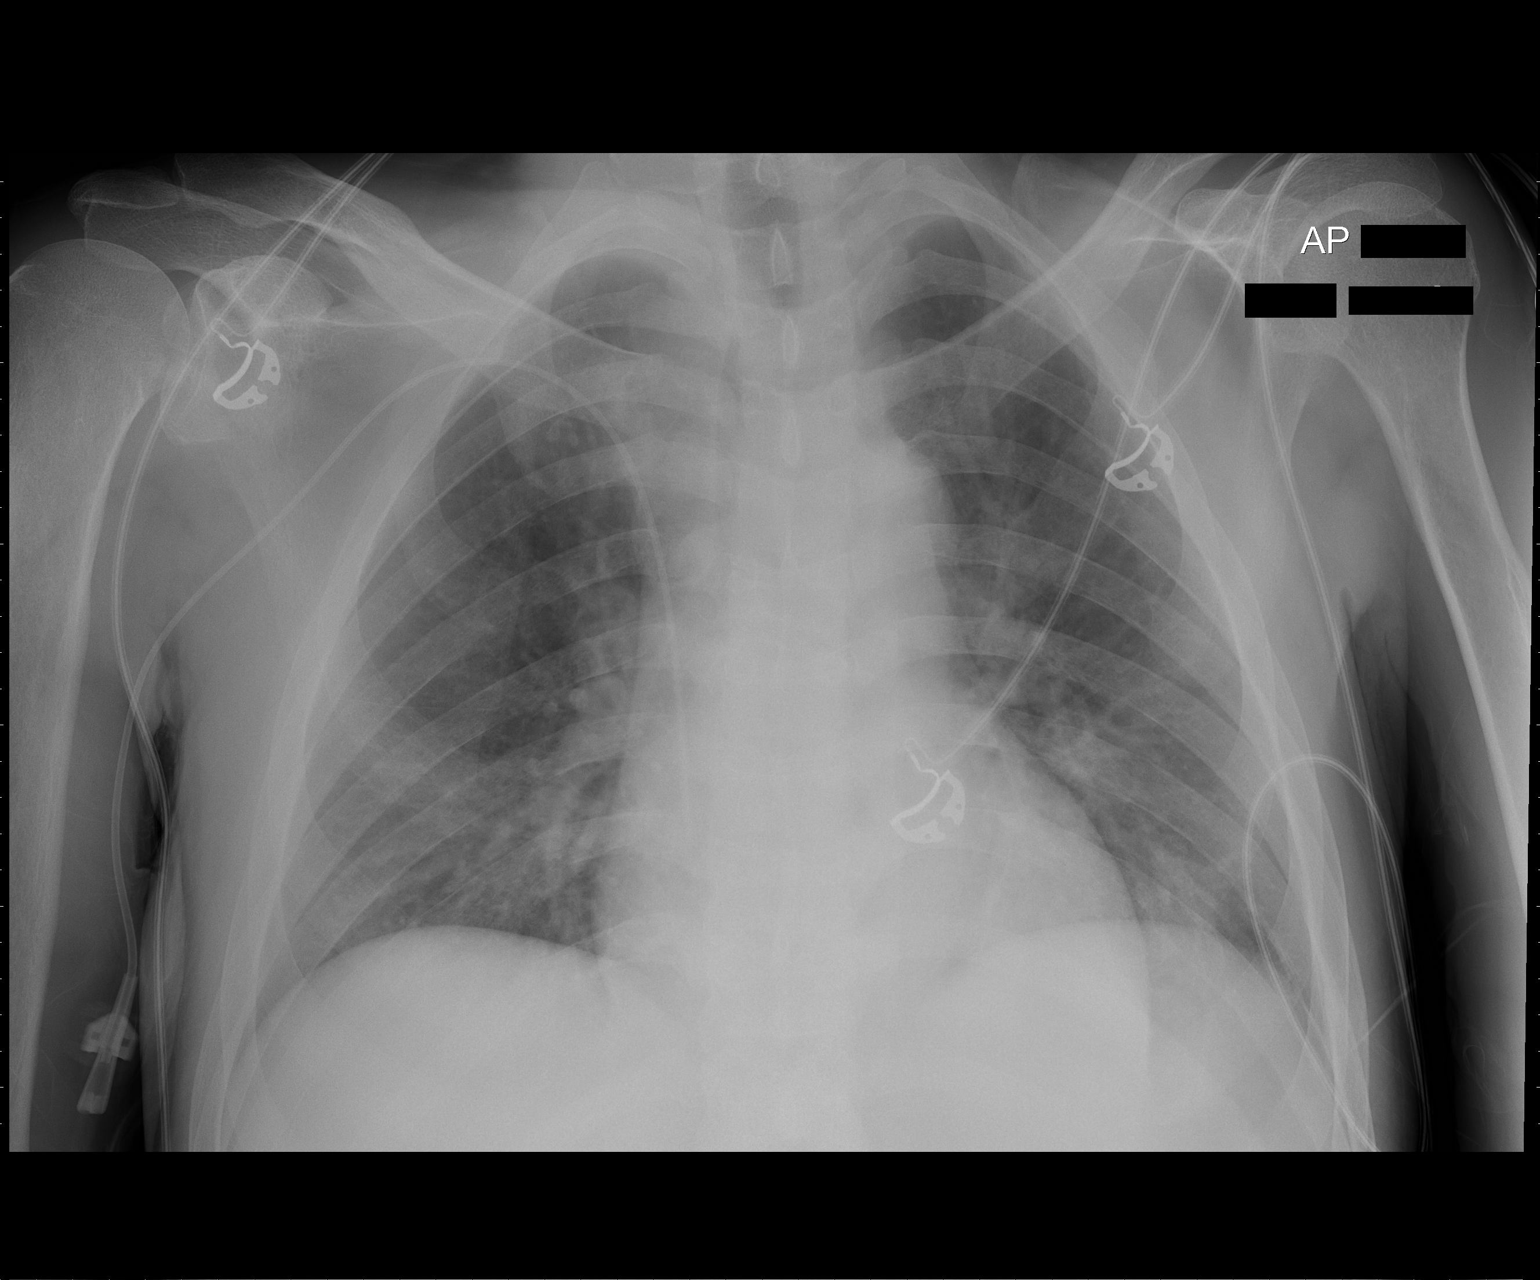

[1 of 1 positions shown; findings below may reference images not displayed]

FINDINGS: The cardiac silhouette, mediastinal and hilar contours
are stable.  The right PICC line tip is in the right atrium.  This
should be pulled back approximately 15 to 20mm.
IMPRESSION: Right PICC line tip is just into the right atrium.  This should be
pulled back approximately 15-20 mm.

## 2012-08-24 ENCOUNTER — Other Ambulatory Visit: Payer: Self-pay | Admitting: Neurology

## 2012-09-22 ENCOUNTER — Other Ambulatory Visit: Payer: Self-pay | Admitting: Neurology

## 2012-10-13 ENCOUNTER — Other Ambulatory Visit: Payer: Self-pay | Admitting: Neurology

## 2012-10-17 ENCOUNTER — Encounter: Payer: Self-pay | Admitting: Neurology

## 2012-10-17 ENCOUNTER — Ambulatory Visit (INDEPENDENT_AMBULATORY_CARE_PROVIDER_SITE_OTHER): Payer: Medicare Other | Admitting: Neurology

## 2012-10-17 ENCOUNTER — Other Ambulatory Visit: Payer: Self-pay | Admitting: Neurology

## 2012-10-17 ENCOUNTER — Telehealth: Payer: Self-pay | Admitting: Neurology

## 2012-10-17 VITALS — BP 139/84 | HR 114 | Ht 65.0 in | Wt 205.0 lb

## 2012-10-17 DIAGNOSIS — G40309 Generalized idiopathic epilepsy and epileptic syndromes, not intractable, without status epilepticus: Secondary | ICD-10-CM | POA: Insufficient documentation

## 2012-10-17 DIAGNOSIS — E871 Hypo-osmolality and hyponatremia: Secondary | ICD-10-CM

## 2012-10-17 DIAGNOSIS — G9340 Encephalopathy, unspecified: Secondary | ICD-10-CM

## 2012-10-17 DIAGNOSIS — D696 Thrombocytopenia, unspecified: Secondary | ICD-10-CM

## 2012-10-17 DIAGNOSIS — Z79899 Other long term (current) drug therapy: Secondary | ICD-10-CM | POA: Insufficient documentation

## 2012-10-17 MED ORDER — LEVETIRACETAM 500 MG PO TABS: 1000.0000 mg | ORAL_TABLET | Freq: Two times a day (BID) | ORAL | Status: DC
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
## 2012-10-17 NOTE — Telephone Encounter (Signed)
I called and spoke with Ms. Lance Campbell and patient will be seen today by Dr. Terrace Arabia

## 2013-05-19 ENCOUNTER — Ambulatory Visit: Payer: Medicaid Other | Admitting: Neurology

## 2013-05-26 ENCOUNTER — Ambulatory Visit (INDEPENDENT_AMBULATORY_CARE_PROVIDER_SITE_OTHER): Payer: Medicare Other | Admitting: Neurology

## 2013-05-26 ENCOUNTER — Encounter (INDEPENDENT_AMBULATORY_CARE_PROVIDER_SITE_OTHER): Payer: Self-pay

## 2013-05-26 ENCOUNTER — Encounter: Payer: Self-pay | Admitting: Neurology

## 2013-05-26 VITALS — BP 121/79 | HR 75 | Ht 65.0 in | Wt 206.0 lb

## 2013-05-26 DIAGNOSIS — G40309 Generalized idiopathic epilepsy and epileptic syndromes, not intractable, without status epilepticus: Secondary | ICD-10-CM

## 2013-05-26 MED ORDER — LEVETIRACETAM 1000 MG PO TABS: 1000.0000 mg | ORAL_TABLET | Freq: Two times a day (BID) | ORAL | Status: DC
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
## 2014-05-28 ENCOUNTER — Encounter: Payer: Self-pay | Admitting: Neurology

## 2014-05-28 ENCOUNTER — Ambulatory Visit (INDEPENDENT_AMBULATORY_CARE_PROVIDER_SITE_OTHER): Payer: Medicare Other | Admitting: Neurology

## 2014-05-28 VITALS — BP 125/94 | HR 76 | Ht 65.0 in | Wt 206.0 lb

## 2014-05-28 DIAGNOSIS — G809 Cerebral palsy, unspecified: Secondary | ICD-10-CM | POA: Insufficient documentation

## 2014-05-28 DIAGNOSIS — G40309 Generalized idiopathic epilepsy and epileptic syndromes, not intractable, without status epilepticus: Secondary | ICD-10-CM

## 2014-05-28 MED ORDER — LEVETIRACETAM 500 MG PO TABS: 1000.0000 mg | ORAL_TABLET | Freq: Two times a day (BID) | ORAL | Status: DC
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
## 2014-09-10 ENCOUNTER — Other Ambulatory Visit: Payer: Self-pay | Admitting: Neurology

## 2015-04-04 ENCOUNTER — Encounter: Payer: Self-pay | Admitting: Nurse Practitioner

## 2015-04-04 ENCOUNTER — Ambulatory Visit (INDEPENDENT_AMBULATORY_CARE_PROVIDER_SITE_OTHER): Payer: Medicare Other | Admitting: Nurse Practitioner

## 2015-04-04 VITALS — BP 136/88 | HR 76 | Ht 65.0 in | Wt 212.0 lb

## 2015-04-04 DIAGNOSIS — G809 Cerebral palsy, unspecified: Secondary | ICD-10-CM

## 2015-04-04 DIAGNOSIS — G40309 Generalized idiopathic epilepsy and epileptic syndromes, not intractable, without status epilepticus: Secondary | ICD-10-CM

## 2015-04-04 MED ORDER — LEVETIRACETAM 1000 MG PO TABS: 1000.0000 mg | ORAL_TABLET | Freq: Two times a day (BID) | ORAL | Status: DC
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
## 2015-04-04 NOTE — Patient Instructions (Signed)
Per group home sheet 

## 2015-04-09 NOTE — Progress Notes (Signed)
I have reviewed and agreed above plan. 

## 2015-04-17 ENCOUNTER — Other Ambulatory Visit: Payer: Self-pay | Admitting: Neurology

## 2015-05-28 ENCOUNTER — Ambulatory Visit: Payer: Medicare Other | Admitting: Nurse Practitioner

## 2015-09-18 ENCOUNTER — Other Ambulatory Visit: Payer: Self-pay | Admitting: Gastroenterology

## 2015-09-19 ENCOUNTER — Other Ambulatory Visit: Payer: Self-pay | Admitting: Gastroenterology

## 2015-09-19 ENCOUNTER — Ambulatory Visit
Admission: RE | Admit: 2015-09-19 | Discharge: 2015-09-19 | Disposition: A | Discharge status: Home / Self Care | Payer: Medicare Other | Source: Ambulatory Visit | Attending: Gastroenterology | Admitting: Gastroenterology

## 2015-09-19 ENCOUNTER — Ambulatory Visit: Payer: Medicare Other

## 2015-09-19 DIAGNOSIS — R0602 Shortness of breath: Secondary | ICD-10-CM

## 2016-04-01 ENCOUNTER — Encounter: Payer: Self-pay | Admitting: Nurse Practitioner

## 2016-04-01 ENCOUNTER — Ambulatory Visit (INDEPENDENT_AMBULATORY_CARE_PROVIDER_SITE_OTHER): Payer: Medicare Other | Admitting: Nurse Practitioner

## 2016-04-01 VITALS — BP 146/97 | HR 64 | Ht 65.0 in | Wt 205.0 lb

## 2016-04-01 DIAGNOSIS — G809 Cerebral palsy, unspecified: Secondary | ICD-10-CM | POA: Diagnosis not present

## 2016-04-01 DIAGNOSIS — G40309 Generalized idiopathic epilepsy and epileptic syndromes, not intractable, without status epilepticus: Secondary | ICD-10-CM | POA: Diagnosis not present

## 2016-04-01 MED ORDER — LEVETIRACETAM 1000 MG PO TABS: ORAL_TABLET | ORAL | 11 refills | Status: DC

## 2016-04-01 NOTE — Patient Instructions (Signed)
Keppra to 1000 mg twice daily Follow-up 1 year Call for seizure activity
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
## 2016-04-03 NOTE — Progress Notes (Signed)
I agree with the above plan 

## 2016-04-20 ENCOUNTER — Other Ambulatory Visit: Payer: Self-pay | Admitting: Nurse Practitioner

## 2017-03-18 ENCOUNTER — Other Ambulatory Visit: Payer: Self-pay | Admitting: Nurse Practitioner

## 2017-03-19 ENCOUNTER — Other Ambulatory Visit: Payer: Self-pay

## 2017-03-19 MED ORDER — LEVETIRACETAM 1000 MG PO TABS: ORAL_TABLET | ORAL | 3 refills | Status: DC
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
## 2017-04-07 ENCOUNTER — Encounter: Payer: Self-pay | Admitting: Nurse Practitioner

## 2017-04-07 ENCOUNTER — Ambulatory Visit (INDEPENDENT_AMBULATORY_CARE_PROVIDER_SITE_OTHER): Payer: Medicare Other | Admitting: Nurse Practitioner

## 2017-04-07 ENCOUNTER — Encounter (INDEPENDENT_AMBULATORY_CARE_PROVIDER_SITE_OTHER): Payer: Self-pay

## 2017-04-07 VITALS — BP 134/83 | HR 61 | Wt 205.0 lb

## 2017-04-07 DIAGNOSIS — G9340 Encephalopathy, unspecified: Secondary | ICD-10-CM

## 2017-04-07 DIAGNOSIS — G809 Cerebral palsy, unspecified: Secondary | ICD-10-CM

## 2017-04-07 DIAGNOSIS — G40309 Generalized idiopathic epilepsy and epileptic syndromes, not intractable, without status epilepticus: Secondary | ICD-10-CM | POA: Diagnosis not present

## 2017-04-07 MED ORDER — LEVETIRACETAM 1000 MG PO TABS: ORAL_TABLET | ORAL | 11 refills | Status: DC

## 2017-04-07 NOTE — Progress Notes (Signed)
I have reviewed and agreed above plan. 

## 2017-04-07 NOTE — Patient Instructions (Signed)
Per skilled sheet 

## 2017-07-19 ENCOUNTER — Other Ambulatory Visit: Payer: Self-pay | Admitting: Nurse Practitioner

## 2017-08-16 ENCOUNTER — Other Ambulatory Visit: Payer: Self-pay | Admitting: Nurse Practitioner

## 2018-01-21 ENCOUNTER — Ambulatory Visit (HOSPITAL_COMMUNITY)
Admission: EM | Admit: 2018-01-21 | Discharge: 2018-01-21 | Disposition: A | Discharge status: Home / Self Care | Payer: Medicare Other
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
## 2018-04-07 ENCOUNTER — Ambulatory Visit (INDEPENDENT_AMBULATORY_CARE_PROVIDER_SITE_OTHER): Payer: Medicare Other | Admitting: Nurse Practitioner

## 2018-04-07 ENCOUNTER — Encounter: Payer: Self-pay | Admitting: Nurse Practitioner

## 2018-04-07 ENCOUNTER — Ambulatory Visit: Payer: Medicare Other | Admitting: Nurse Practitioner

## 2018-04-07 VITALS — BP 156/97 | HR 59 | Ht 65.0 in | Wt 177.6 lb

## 2018-04-07 DIAGNOSIS — R269 Unspecified abnormalities of gait and mobility: Secondary | ICD-10-CM

## 2018-04-07 DIAGNOSIS — H548 Legal blindness, as defined in USA: Secondary | ICD-10-CM

## 2018-04-07 DIAGNOSIS — G40309 Generalized idiopathic epilepsy and epileptic syndromes, not intractable, without status epilepticus: Secondary | ICD-10-CM | POA: Diagnosis not present

## 2018-04-07 DIAGNOSIS — G809 Cerebral palsy, unspecified: Secondary | ICD-10-CM | POA: Diagnosis not present

## 2018-04-07 MED ORDER — LEVETIRACETAM 1000 MG PO TABS: ORAL_TABLET | ORAL | 11 refills | Status: DC

## 2018-04-07 NOTE — Progress Notes (Signed)
I have reviewed and agreed above plan. 

## 2018-04-07 NOTE — Patient Instructions (Signed)
Continue Keppra  1000 mg twice daily, will refill for 1 year Follow-up 1 year CBC CMP follow through primary care according to caregiver Call for seizure activity

## 2019-01-11 ENCOUNTER — Other Ambulatory Visit: Payer: Self-pay

## 2019-01-11 DIAGNOSIS — Z20822 Contact with and (suspected) exposure to covid-19: Secondary | ICD-10-CM

## 2019-01-12 LAB — NOVEL CORONAVIRUS, NAA: SARS-CoV-2, NAA: NOT DETECTED

## 2019-01-20 ENCOUNTER — Telehealth: Payer: Self-pay

## 2019-01-20 NOTE — Telephone Encounter (Signed)
Phone call to Doneen Poisson, Scientist, physiological of [facility] [employer].  Requested to fax pt's. COVID results to [phone].  Will fax to the above number, per Administrator's request.

## 2019-02-13 ENCOUNTER — Other Ambulatory Visit: Payer: Self-pay | Admitting: *Deleted

## 2019-02-13 DIAGNOSIS — Z20822 Contact with and (suspected) exposure to covid-19: Secondary | ICD-10-CM

## 2019-02-15 LAB — NOVEL CORONAVIRUS, NAA: SARS-CoV-2, NAA: NOT DETECTED

## 2019-03-06 ENCOUNTER — Telehealth: Payer: Self-pay | Admitting: Neurology

## 2019-03-06 NOTE — Telephone Encounter (Signed)
LVM to r/s 1/14 due to MD being out

## 2019-03-13 ENCOUNTER — Encounter: Payer: Self-pay | Admitting: Neurology

## 2019-03-13 NOTE — Telephone Encounter (Signed)
C/a letter sent for 1/14

## 2019-04-13 ENCOUNTER — Ambulatory Visit: Payer: Medicare Other | Admitting: Neurology

## 2019-05-18 ENCOUNTER — Ambulatory Visit: Payer: Medicare Other

## 2019-05-22 ENCOUNTER — Ambulatory Visit: Payer: Medicare Other | Attending: Family

## 2019-05-22 DIAGNOSIS — Z23 Encounter for immunization: Secondary | ICD-10-CM | POA: Insufficient documentation

## 2019-05-22 NOTE — Progress Notes (Signed)
   Covid-19 Vaccination Clinic  Name:  Lance Campbell    MRN: [id number] DOB: 03/09/1961  05/22/2019  Mr. Lance Campbell was observed post Covid-19 immunization for 15 minutes without incidence. He was provided with Vaccine Information Sheet and instruction to access the V-Safe system.   Mr. Lance Campbell was instructed to call 911 with any severe reactions post vaccine: Marland Kitchen Difficulty breathing  . Swelling of your face and throat  . A fast heartbeat  . A bad rash all over your body  . Dizziness and weakness    Immunizations Administered    Name Date Dose VIS Date Route   Moderna COVID-19 Vaccine 05/22/2019  9:48 AM 0.5 mL 02/28/2019 Intramuscular   Manufacturer: Moderna   Lot: [id number]   NDC: 30076-226-33

## 2019-06-20 ENCOUNTER — Ambulatory Visit: Payer: Medicare Other | Attending: Family

## 2019-06-20 DIAGNOSIS — Z23 Encounter for immunization: Secondary | ICD-10-CM

## 2019-06-20 NOTE — Progress Notes (Signed)
   Covid-19 Vaccination Clinic  Name:  Lance Campbell    MRN: [id number] DOB: [birth year]  06/20/2019  Mr. [name] was observed post Covid-19 immunization for 15 minutes without incident. He was provided with Vaccine Information Sheet and instruction to access the V-Safe system.   Mr. [name] was instructed to call 911 with any severe reactions post vaccine: Marland Kitchen Difficulty breathing  . Swelling of face and throat  . A fast heartbeat  . A bad rash all over body  . Dizziness and weakness   Immunizations Administered    Name Date Dose VIS Date Route   Moderna COVID-19 Vaccine 06/20/2019  9:19 AM 0.5 mL 02/28/2019 Intramuscular   Manufacturer: Moderna   Lot: [id number]   NDC: 62831-517-61

## 2019-06-21 ENCOUNTER — Ambulatory Visit: Payer: Medicare Other | Admitting: Neurology

## 2019-06-22 ENCOUNTER — Ambulatory Visit (INDEPENDENT_AMBULATORY_CARE_PROVIDER_SITE_OTHER): Payer: Medicare Other | Admitting: Neurology

## 2019-06-22 ENCOUNTER — Other Ambulatory Visit: Payer: Self-pay

## 2019-06-22 ENCOUNTER — Encounter: Payer: Self-pay | Admitting: Neurology

## 2019-06-22 VITALS — BP 142/82 | HR 68 | Temp 97.0°F | Ht 65.0 in | Wt 160.5 lb

## 2019-06-22 DIAGNOSIS — H548 Legal blindness, as defined in USA: Secondary | ICD-10-CM

## 2019-06-22 DIAGNOSIS — G40209 Localization-related (focal) (partial) symptomatic epilepsy and epileptic syndromes with complex partial seizures, not intractable, without status epilepticus: Secondary | ICD-10-CM | POA: Insufficient documentation

## 2019-06-22 DIAGNOSIS — R269 Unspecified abnormalities of gait and mobility: Secondary | ICD-10-CM | POA: Diagnosis not present

## 2019-06-22 MED ORDER — LEVETIRACETAM 1000 MG PO TABS: ORAL_TABLET | ORAL | 4 refills | Status: DC
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
## 2019-06-29 ENCOUNTER — Other Ambulatory Visit: Payer: Self-pay | Admitting: Neurology

## 2019-07-19 ENCOUNTER — Other Ambulatory Visit: Payer: Self-pay | Admitting: Neurology

## 2019-10-20 ENCOUNTER — Other Ambulatory Visit: Payer: Self-pay | Admitting: Neurology

## 2020-01-26 ENCOUNTER — Other Ambulatory Visit: Payer: Self-pay | Admitting: Internal Medicine

## 2020-01-26 DIAGNOSIS — N1831 Chronic kidney disease, stage 3a: Secondary | ICD-10-CM

## 2020-01-26 DIAGNOSIS — I129 Hypertensive chronic kidney disease with stage 1 through stage 4 chronic kidney disease, or unspecified chronic kidney disease: Secondary | ICD-10-CM

## 2020-02-08 ENCOUNTER — Ambulatory Visit
Admission: RE | Admit: 2020-02-08 | Discharge: 2020-02-08 | Disposition: A | Discharge status: Home / Self Care | Payer: Medicare Other | Source: Ambulatory Visit | Attending: Internal Medicine | Admitting: Internal Medicine

## 2020-02-08 DIAGNOSIS — I129 Hypertensive chronic kidney disease with stage 1 through stage 4 chronic kidney disease, or unspecified chronic kidney disease: Secondary | ICD-10-CM

## 2020-02-08 DIAGNOSIS — N1831 Chronic kidney disease, stage 3a: Secondary | ICD-10-CM

## 2020-05-25 ENCOUNTER — Other Ambulatory Visit: Payer: Self-pay | Admitting: Neurology

## 2020-05-27 ENCOUNTER — Telehealth: Payer: Self-pay | Admitting: Neurology

## 2020-05-27 MED ORDER — DONEPEZIL HCL 5 MG PO TABS: ORAL_TABLET | ORAL | 0 refills | Status: DC

## 2020-05-27 NOTE — Telephone Encounter (Signed)
He has pending appt 06/25/20. Refills sent to requested pharmacy.

## 2020-05-27 NOTE — Telephone Encounter (Signed)
[facility] Victorino Dike) request refill donepezil (ARICEPT) 5 MG tablet at [facility] [place] [place]

## 2020-06-25 ENCOUNTER — Ambulatory Visit (INDEPENDENT_AMBULATORY_CARE_PROVIDER_SITE_OTHER): Payer: Medicare Other | Admitting: Neurology

## 2020-06-25 ENCOUNTER — Encounter: Payer: Self-pay | Admitting: Neurology

## 2020-06-25 VITALS — BP 128/85 | HR 66 | Ht 65.0 in | Wt 167.0 lb

## 2020-06-25 DIAGNOSIS — G40309 Generalized idiopathic epilepsy and epileptic syndromes, not intractable, without status epilepticus: Secondary | ICD-10-CM | POA: Diagnosis not present

## 2020-06-25 MED ORDER — LEVETIRACETAM 1000 MG PO TABS: ORAL_TABLET | ORAL | 4 refills | Status: DC

## 2020-06-25 NOTE — Patient Instructions (Signed)
Continue Keppra at current dosing Call for seizure activity  Follow-up  In 1 year
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
## 2020-09-12 ENCOUNTER — Other Ambulatory Visit: Payer: Self-pay | Admitting: Neurology

## 2021-06-26 ENCOUNTER — Encounter: Payer: Self-pay | Admitting: Neurology

## 2021-06-26 ENCOUNTER — Ambulatory Visit (INDEPENDENT_AMBULATORY_CARE_PROVIDER_SITE_OTHER): Payer: Medicare Other | Admitting: Neurology

## 2021-06-26 VITALS — BP 135/81 | HR 85 | Ht 65.0 in | Wt 176.5 lb

## 2021-06-26 DIAGNOSIS — G40309 Generalized idiopathic epilepsy and epileptic syndromes, not intractable, without status epilepticus: Secondary | ICD-10-CM | POA: Diagnosis not present

## 2021-06-26 MED ORDER — LEVETIRACETAM 1000 MG PO TABS
ORAL_TABLET | ORAL | 4 refills | Status: DC
Start: 2021-06-26 — End: 2022-07-09
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
## 2021-06-26 NOTE — Patient Instructions (Signed)
Continue the Keppra ?Okay to come here as needed if PCP will refill Keppra going forward, otherwise return back in 1 year  ?

## 2022-04-27 ENCOUNTER — Telehealth: Payer: Self-pay | Admitting: Physician Assistant

## 2022-04-27 NOTE — Telephone Encounter (Signed)
Scheduled appt per 12/5 referral. Pt is aware of appt date and time. Pt is aware to arrive 15 mins prior to appt time and to bring and updated insurance card. Pt is aware of appt location.   

## 2022-05-18 NOTE — Progress Notes (Deleted)
[place] Telephone:([phone]   Fax:(336) [place] NOTE  Patient Care Team: Kristie Cowman, MD as PCP - General (Family Medicine)  Hematological/Oncological History Labs from PCP, Dr. Kristie Cowman 11/11/2021: WBC 3.6 (L), Hgb 12.9, Plt 110 (L),  04/01/2022: WBC 4.3, Hgb 14.0, Plt 114 (L).  HIV nonreactive,  05/19/2022: Establish care with [facility]  CHIEF COMPLAINTS/PURPOSE OF CONSULTATION:  Thrombocytopenia  HISTORY OF PRESENTING ILLNESS:  Lance Campbell 62 y.o. male with medical history significant for ***  On review of the previous records ***  On exam today ***  MEDICAL HISTORY:  Past Medical History:  Diagnosis Date   Blindness of both eyes, impairment level not further specified    Encephalopathy, unspecified    Encounter for long-term (current) use of other medications    Generalized convulsive epilepsy without mention of intractable epilepsy    Hyposmolality and/or hyponatremia    Localization-related (focal) (partial) epilepsy and epileptic syndromes with complex partial seizures, without mention of intractable epilepsy    Thrombocytopenia, unspecified ([place])     SURGICAL HISTORY: Past Surgical History:  Procedure Laterality Date   None      SOCIAL HISTORY: Social History   Socioeconomic History   Marital status: Single    Spouse name: Not on file   Number of children: 0   Years of education: day progra   Highest education level: Not on file  Occupational History    Comment: Disabled  Tobacco Use   Smoking status: Never   Smokeless tobacco: Never  Substance and Sexual Activity   Alcohol use: No   Drug use: No   Sexual activity: Not on file  Other Topics Concern   Not on file  Social History Narrative   Patient is a resident in a group home [facility] ).. Blind male. Cerebral Palsy, Mental retardation.   Caffeine- Soda    Right handed.         Contact [facility] provider. [place]  [phone] or [place]   Social Determinants of Health   Financial Resource Strain: Not on file  Food Insecurity: Not on file  Transportation Needs: Not on file  Physical Activity: Not on file  Stress: Not on file  Social Connections: Not on file  Intimate Partner Violence: Not on file    FAMILY HISTORY: No family history on file.  ALLERGIES:  has No Known Allergies.  MEDICATIONS:  Current Outpatient Medications  Medication Sig Dispense Refill   Azelaic Acid 15 % cream Apply topically daily. Reported on 04/04/2015     benztropine (COGENTIN) 0.5 MG tablet Take 0.5 mg by mouth 2 (two) times daily.     Ciclopirox 1 % shampoo      donepezil (ARICEPT) 5 MG tablet TAKE (1) TABLET BY MOUTH DAILY. 90 tablet 2   gabapentin (NEURONTIN) 400 MG capsule Take 400 mg by mouth. One tablet in the evening.     hydrocortisone 2.5 % cream Apply topically 3 (three) times daily as needed.     hydrocortisone ointment 0.5 %      levETIRAcetam (KEPPRA) 1000 MG tablet TAKE (1) TABLET BY MOUTH TWICE DAILY. 180 tablet 4   losartan (COZAAR) 50 MG tablet Take 50 mg by mouth daily.     metroNIDAZOLE (METROGEL) 1 % gel Apply topically daily.     naproxen (NAPRELAN) 500 MG 24 hr tablet Take 500 mg by mouth 2 (two) times daily.  OLANZapine (ZYPREXA) 20 MG tablet Take 15 mg by mouth at bedtime.      tamsulosin (FLOMAX) 0.4 MG CAPS capsule Take 0.8 mg by mouth daily.     traMADol (ULTRAM) 50 MG tablet Take 50 mg by mouth. 1-2 every 6 hours prn pain     Vitamin D, Ergocalciferol, (DRISDOL) 1.25 MG (50000 UNIT) CAPS capsule Take 50,000 Units by mouth once a week.     No current facility-administered medications for this visit.    REVIEW OF SYSTEMS:   Constitutional: ( - ) fevers, ( - )  chills , ( - ) night sweats Eyes: ( - ) blurriness of vision, ( - ) double vision, ( - ) watery eyes Ears, nose, mouth, throat, and face: ( - ) mucositis, ( - ) sore throat Respiratory: ( - )  cough, ( - ) dyspnea, ( - ) wheezes Cardiovascular: ( - ) palpitation, ( - ) chest discomfort, ( - ) lower extremity swelling Gastrointestinal:  ( - ) nausea, ( - ) heartburn, ( - ) change in bowel habits Skin: ( - ) abnormal skin rashes Lymphatics: ( - ) new lymphadenopathy, ( - ) easy bruising Neurological: ( - ) numbness, ( - ) tingling, ( - ) new weaknesses Behavioral/Psych: ( - ) mood change, ( - ) new changes  All other systems were reviewed with the patient and are negative.  PHYSICAL EXAMINATION: ECOG PERFORMANCE STATUS: {CHL ONC ECOG [phone]}  There were no vitals filed for this visit. There were no vitals filed for this visit.  GENERAL: well appearing *** in NAD  SKIN: skin color, texture, turgor are normal, no rashes or significant lesions EYES: conjunctiva are pink and non-injected, sclera clear OROPHARYNX: no exudate, no erythema; lips, buccal mucosa, and tongue normal  NECK: supple, non-tender LYMPH:  no palpable lymphadenopathy in the cervical, axillary or supraclavicular lymph nodes.  LUNGS: clear to auscultation and percussion with normal breathing effort HEART: regular rate & rhythm and no murmurs and no lower extremity edema ABDOMEN: soft, non-tender, non-distended, normal bowel sounds Musculoskeletal: no cyanosis of digits and no clubbing  PSYCH: alert & oriented x 3, fluent speech NEURO: no focal motor/sensory deficits  LABORATORY DATA:  I have reviewed the data as listed    Latest Ref Rng & Units 09/27/2009   10:09 AM 12/26/2008    5:51 AM 12/25/2008    5:10 PM  CBC  WBC 4.0 - 10.5 K/uL 3.5  5.4    Hemoglobin 13.0 - 17.0 g/dL 12.1  10.8  10.2   Hematocrit 39.0 - 52.0 % 34.5  31.3  30.0   Platelets 150 - 400 K/uL 49 RESULT REPEATED AND VERIFIED SPECIMEN CHECKED FOR CLOTS PLATELET COUNT CONFIRMED BY SMEAR  122         Latest Ref Rng & Units 09/27/2009   10:09 AM 12/28/2008    3:56 AM 12/27/2008    5:30 AM  CMP  Glucose 70 - 99 mg/dL 107  83  86   BUN  6 - 23 mg/dL 19  9  7   $ Creatinine 0.4 - 1.5 mg/dL 1.72  1.18  1.12   Sodium 135 - 145 mEq/L 144  145  144   Potassium 3.5 - 5.1 mEq/L 4.1  3.8  3.2   Chloride 96 - 112 mEq/L 116  114  114   CO2 19 - 32 mEq/L 21  26  24   $ Calcium 8.4 - 10.5 mg/dL 10.0  9.8  10.0  Total Protein 6.0 - 8.3 g/dL 6.6     Total Bilirubin 0.3 - 1.2 mg/dL 0.4     Alkaline Phos 39 - 117 U/L 56     AST 0 - 37 U/L 81     ALT 0 - 53 U/L 52        PATHOLOGY: ***  BLOOD FILM: *** Review of the peripheral blood smear showed normal appearing white cells with neutrophils that were appropriately lobated and granulated. There was no predominance of bi-lobed or hyper-segmented neutrophils appreciated. No Dohle bodies were noted. There was no left shifting, immature forms or blasts noted. Lymphocytes remain normal in size without any predominance of large granular lymphocytes. Red cells show no anisopoikilocytosis, macrocytes , microcytes or polychromasia. There were no schistocytes, target cells, echinocytes, acanthocytes, dacrocytes, or stomatocytes.There was no rouleaux formation, nucleated red cells, or intra-cellular inclusions noted. The platelets are normal in size, shape, and color without any clumping evident.  RADIOGRAPHIC STUDIES: I have personally reviewed the radiological images as listed and agreed with the findings in the report. No results found.  ASSESSMENT & PLAN ***   I reviewed potential etiologies for thrombocytopenia including liver disease, splenomegaly, infectious processes, nutritional anemias, immune mediated and bone marrow disorders. Patient is not taking any medications or recent infections. Patient will proceed with labs today to check CBC, CMP, Vitamin B12, MMA, folate,  Hepatitis B and C serologies, HIV serology,  and save smear.   #Thrombocytopenia, etiology unknown: --Labs today to check CBC w/diff, CMP, B12 level, folate level, Hep B and C serologies, HIV serology. --Evaluate for  platelet abnormality with save smear. --Evaluate for liver disease and/or splenomegaly with abdominal US --RTC based on above workup.    No orders of the defined types were placed in this encounter.   All questions were answered. The patient knows to call the clinic with any problems, questions or concerns.  I have spent a total of {CHL ONC TIME VISIT - [id number] minutes of face-to-face and non-face-to-face time, preparing to see the patient, obtaining and/or reviewing separately obtained history, performing a medically appropriate examination, counseling and educating the patient, ordering medications/tests/procedures, referring and communicating with other health care professionals, documenting clinical information in the electronic health record, independently interpreting results and communicating results to the patient, and care coordination.   Dede Query, PA-C Department of Hematology/Oncology [place] at [facility] Phone: [phone]

## 2022-05-19 ENCOUNTER — Inpatient Hospital Stay: Payer: 59

## 2022-05-19 ENCOUNTER — Inpatient Hospital Stay: Payer: 59 | Attending: Physician Assistant | Admitting: Physician Assistant

## 2022-07-01 ENCOUNTER — Ambulatory Visit: Payer: Medicare Other | Admitting: Neurology

## 2022-07-09 ENCOUNTER — Other Ambulatory Visit: Payer: Self-pay | Admitting: Neurology
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
## 2022-07-23 ENCOUNTER — Ambulatory Visit (INDEPENDENT_AMBULATORY_CARE_PROVIDER_SITE_OTHER): Payer: 59 | Admitting: Neurology

## 2022-07-23 ENCOUNTER — Encounter: Payer: Self-pay | Admitting: Neurology

## 2022-07-23 VITALS — BP 137/70 | HR 66 | Resp 15 | Wt 189.5 lb

## 2022-07-23 DIAGNOSIS — G40309 Generalized idiopathic epilepsy and epileptic syndromes, not intractable, without status epilepticus: Secondary | ICD-10-CM

## 2022-07-23 MED ORDER — LEVETIRACETAM 1000 MG PO TABS: ORAL_TABLET | ORAL | 4 refills | Status: DC

## 2022-07-23 NOTE — Patient Instructions (Signed)
Continue current medication, call for seizures  Meds ordered this encounter  Medications   levETIRAcetam (KEPPRA) 1000 MG tablet    Sig: Take 1 tablet twice daily    Dispense:  180 tablet    Refill:  4

## 2022-12-02 ENCOUNTER — Other Ambulatory Visit (HOSPITAL_COMMUNITY): Payer: Self-pay | Admitting: Gastroenterology

## 2022-12-02 DIAGNOSIS — D696 Thrombocytopenia, unspecified: Secondary | ICD-10-CM

## 2023-03-22 ENCOUNTER — Other Ambulatory Visit: Payer: Self-pay | Admitting: Gastroenterology

## 2023-03-22 DIAGNOSIS — D696 Thrombocytopenia, unspecified: Secondary | ICD-10-CM

## 2023-07-28 ENCOUNTER — Encounter: Payer: Self-pay | Admitting: Neurology

## 2023-07-28 ENCOUNTER — Ambulatory Visit: Payer: 59 | Admitting: Neurology
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
## 2023-08-18 ENCOUNTER — Telehealth: Payer: Self-pay | Admitting: *Deleted

## 2023-08-18 ENCOUNTER — Other Ambulatory Visit: Payer: Self-pay | Admitting: Neurology

## 2023-08-18 NOTE — Telephone Encounter (Signed)
 Called caregiver/Kaye Nolon Baxter on [place]. Relayed we received refill request on Keppra . However, he was last seen 06/2022 and no showed appt 06/2023. Has nothing scheduled. Scheduled appt for 10/26/23 at 10am with Dr. Gracie Lav. Aware we will refill med until appt but must f/u for continued refills. She verbalized understanding. Refills sent in.

## 2023-09-27 ENCOUNTER — Inpatient Hospital Stay

## 2023-09-27 ENCOUNTER — Encounter: Payer: Self-pay | Admitting: Hematology and Oncology

## 2023-09-27 ENCOUNTER — Inpatient Hospital Stay: Attending: Hematology and Oncology | Admitting: Hematology and Oncology

## 2023-09-27 VITALS — BP 108/70 | HR 67 | Temp 98.2°F | Resp 18 | Ht 65.0 in | Wt 184.2 lb

## 2023-09-27 DIAGNOSIS — D72819 Decreased white blood cell count, unspecified: Secondary | ICD-10-CM | POA: Insufficient documentation

## 2023-09-27 DIAGNOSIS — D696 Thrombocytopenia, unspecified: Secondary | ICD-10-CM | POA: Insufficient documentation

## 2023-09-27 NOTE — Assessment & Plan Note (Addendum)
 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

## 2023-09-27 NOTE — Progress Notes (Signed)
 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  All questions were answered. The patient knows to call the clinic with any problems, questions or concerns. No barriers to learning was detected. The total time spent in the appointment was 55 minutes encounter with patients including review of chart and various tests results, discussions about plan of care and coordination of care plan  Almarie Bedford, MD 09/27/2023 10:50 AM  CHIEF COMPLAINTS/PURPOSE OF CONSULTATION:  Chronic leukopenia and thrombocytopenia  HISTORY OF PRESENTING ILLNESS:  Lance Campbell 63 y.o. male is here because of chronic leukopenia and thrombocytopenia.  He was found to have abnormal CBC from recent blood work I have reviewed records scanned in the computer from his primary care doctor as well as results from [facility] He has chronic intermittent leukopenia usually white count in the 2.5-3 range as well as platelet count typically around 130,000 His platelet count has been as low as 49,000 in 2011 History is obtained through caregiver.  The patient provide minimum history I have also reviewed his electronic records There has been no reported bruising/bleeding, such as spontaneous epistaxis, hematuria,  melena or hematochezia The patient denies history of liver disease, exposure to heparin, history of cardiac murmur/prior cardiovascular surgery or recent new medications He nad no blood or platelet transfusions Due to his chronic disability, the patient is on multiple medications including antiseizure and antipsychiatric medications long-term  MEDICAL HISTORY:  Past Medical History:  Diagnosis Date   Blindness of both eyes, impairment level not further specified    Encephalopathy, unspecified    Encounter for long-term (current) use of other medications    Generalized convulsive epilepsy without mention of intractable epilepsy    Hyposmolality and/or hyponatremia    Localization-related (focal) (partial) epilepsy and epileptic syndromes with complex partial seizures, without mention of intractable epilepsy    Thrombocytopenia, unspecified (HCC)     SURGICAL HISTORY: Past Surgical History:  Procedure Laterality Date   None      SOCIAL HISTORY: Social History   Socioeconomic History   Marital status: Single    Spouse name: Not on file   Number of children: 0   Years of education: day progra   Highest education level: Not on file  Occupational History    Comment: Disabled  Tobacco Use   Smoking status: Never   Smokeless tobacco: Never  Substance and Sexual Activity   Alcohol use: No   Drug use: No   Sexual activity: Not Currently  Other Topics Concern   Not on file  Social History Narrative   Patient is a resident in a group home [employer] ).. Blind male. Cerebral Palsy, Mental retardation.   Caffeine- Soda    Right handed.         Contact [facility] provider. [phone] or [phone]  [facility] [phone]   Social Drivers of Health   Financial Resource Strain: Not on file  Food Insecurity: Not on file  Transportation Needs: Not on file  Physical Activity: Not on file  Stress: Not on file  Social Connections: Not on file   Intimate Partner Violence: Not on file    FAMILY HISTORY: History reviewed. No pertinent family history.  ALLERGIES:  has no known allergies.  MEDICATIONS:  Current Outpatient Medications  Medication Sig Dispense Refill   Azelaic Acid 15 % cream Apply topically daily. Reported on 04/04/2015     benztropine (COGENTIN) 0.5 MG tablet Take 0.5 mg by mouth 2 (two) times daily.     Ciclopirox 1 % shampoo      donepezil  (ARICEPT ) 5 MG tablet TAKE (1) TABLET BY MOUTH DAILY. 90 tablet 2   gabapentin (NEURONTIN) 400 MG capsule Take 400 mg by mouth. One tablet in the evening.     hydrocortisone 2.5 % cream Apply topically 3 (three) times daily as needed.     levETIRAcetam  (KEPPRA ) 1000 MG tablet TAKE (1) TABLET BY MOUTH TWICE DAILY. Must keep follow up 10/26/23 for ongoing refills 60 tablet 2   losartan (COZAAR) 50 MG tablet Take 50 mg by mouth daily.     metroNIDAZOLE (METROGEL) 1 % gel Apply topically daily.     naproxen (NAPRELAN) 500 MG 24 hr tablet Take 500 mg by mouth 2 (two) times daily.      OLANZapine (ZYPREXA) 20 MG tablet Take 15 mg by mouth at bedtime.      tamsulosin (FLOMAX) 0.4 MG CAPS capsule Take 0.8 mg by mouth daily.     Vitamin D, Ergocalciferol, (DRISDOL) 1.25 MG (50000 UNIT) CAPS capsule Take 50,000 Units by mouth once a week.     No current facility-administered medications for this visit.    REVIEW OF SYSTEMS:  All other systems were reviewed with the patient's caregiver and are negative.  PHYSICAL EXAMINATION: ECOG PERFORMANCE STATUS: 0 - Asymptomatic  Vitals:   09/27/23 0834  BP: 108/70  Pulse: 67  Resp: 18  Temp: 98.2 F (36.8 C)  SpO2: 100%   Filed Weights   09/27/23 0834  Weight: 184 lb 3.2 oz (83.6 kg)    GENERAL:alert, no distress and comfortable SKIN: skin color, texture, turgor are normal, no rashes or significant lesions EYES: normal, conjunctiva are pink and non-injected, sclera clear OROPHARYNX:no exudate, no erythema and lips, buccal mucosa,  and tongue normal  NECK: supple, thyroid normal size, non-tender, without nodularity LYMPH:  no palpable lymphadenopathy in the cervical, axillary or inguinal LUNGS: clear to auscultation and percussion with normal breathing effort HEART: regular rate & rhythm and no murmurs and no lower extremity edema ABDOMEN:abdomen soft, non-tender and normal bowel sounds Musculoskeletal:no cyanosis of digits and no clubbing  PSYCH: alert   LABORATORY DATA:  I have reviewed the data as listed I have reviewed his labs through electronic record, scanned records and [facility] results

## 2023-09-27 NOTE — Assessment & Plan Note (Signed)
 He has chronic intermittent leukopenia also related to his antiseizure/antipsychotic medication  He is not symptomatic He does not need long-term follow-up or workup for the

## 2023-10-26 ENCOUNTER — Encounter: Payer: Self-pay | Admitting: Neurology

## 2023-10-26 ENCOUNTER — Ambulatory Visit (INDEPENDENT_AMBULATORY_CARE_PROVIDER_SITE_OTHER): Admitting: Neurology

## 2023-10-26 VITALS — BP 109/69 | HR 58 | Ht 65.0 in | Wt 185.0 lb

## 2023-10-26 DIAGNOSIS — G40309 Generalized idiopathic epilepsy and epileptic syndromes, not intractable, without status epilepticus: Secondary | ICD-10-CM

## 2023-10-26 DIAGNOSIS — G802 Spastic hemiplegic cerebral palsy: Secondary | ICD-10-CM

## 2023-10-26 MED ORDER — LEVETIRACETAM 1000 MG PO TABS: ORAL_TABLET | ORAL | 3 refills | Status: AC
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
## 2024-03-16 ENCOUNTER — Encounter: Payer: Self-pay | Admitting: Gastroenterology
